# Patient Record
Sex: Male | Born: 2000 | Race: Black or African American | Hispanic: No | Marital: Single | Smoking: Never smoker
Health system: Southern US, Community
[De-identification: ages and names within clinical notes are randomized; demographics above are authoritative.]

## PROBLEM LIST (undated history)

## (undated) DIAGNOSIS — F909 Attention-deficit hyperactivity disorder, unspecified type: Secondary | ICD-10-CM

---
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
## 2021-06-20 ENCOUNTER — Ambulatory Visit: Payer: Self-pay

## 2021-06-22 ENCOUNTER — Other Ambulatory Visit: Payer: Self-pay

## 2021-06-22 ENCOUNTER — Ambulatory Visit
Admission: RE | Admit: 2021-06-22 | Discharge: 2021-06-22 | Disposition: A | Discharge status: Home / Self Care | Payer: Medicaid Other | Source: Ambulatory Visit | Attending: Physician Assistant | Admitting: Physician Assistant

## 2021-06-22 VITALS — BP 101/66 | HR 72 | Temp 97.9°F | Resp 18

## 2021-06-22 DIAGNOSIS — R350 Frequency of micturition: Secondary | ICD-10-CM | POA: Insufficient documentation

## 2021-06-22 DIAGNOSIS — Z113 Encounter for screening for infections with a predominantly sexual mode of transmission: Secondary | ICD-10-CM | POA: Diagnosis not present

## 2021-06-22 LAB — POCT URINALYSIS DIP (MANUAL ENTRY)
Bilirubin, UA: NEGATIVE
Blood, UA: NEGATIVE
Glucose, UA: NEGATIVE mg/dL
Ketones, POC UA: NEGATIVE mg/dL
Leukocytes, UA: NEGATIVE
Nitrite, UA: NEGATIVE
Protein Ur, POC: NEGATIVE mg/dL
Spec Grav, UA: 1.025 (ref 1.010–1.025)
Urobilinogen, UA: 0.2 E.U./dL
pH, UA: 6 (ref 5.0–8.0)

## 2021-06-22 NOTE — ED Provider Notes (Signed)
[facility]    CSN: [id number] Arrival date & time: 06/22/21  1409      History   Chief Complaint Chief Complaint  Patient presents with   Urinary Frequency    HPI Brent Heath is a 21 y.o. male.   Patient here today for evaluation of urinary frequency that started 1-2 weeks ago. HE has not had any dysuria, discharge and hematuria. He does not report any treatment for symptoms.   The history is provided by the patient.  Urinary Frequency Pertinent negatives include no shortness of breath.   Past Medical History:  Diagnosis Date   Attention deficit hyperactivity disorder (ADHD)    Concussion     There are no problems to display for this patient.   History reviewed. No pertinent surgical history.     Home Medications    Prior to Admission medications   Medication Sig Start Date End Date Taking? Authorizing Provider  cetirizine (ZYRTEC) 10 MG tablet Take 1 tablet (10 mg total) by mouth daily for 15 days. 06/29/17 07/14/17  Wieters, Hallie C, PA-C  fluticasone (FLONASE) 50 MCG/ACT nasal spray Place 2 sprays into both nostrils daily for 5 days. 06/29/17 07/04/17  Wieters, Hallie C, PA-C  lisdexamfetamine (VYVANSE) 30 MG capsule Take 30 mg by mouth daily.    [provider]    Family History History reviewed. No pertinent family history.  Social History Social History   Tobacco Use   Smoking status: Never   Smokeless tobacco: Never  Substance Use Topics   Alcohol use: No   Drug use: No     Allergies   Amoxicillin and Penicillins   Review of Systems Review of Systems  Constitutional:  Negative for chills and fever.  Eyes:  Negative for discharge and redness.  Respiratory:  Negative for shortness of breath.   Genitourinary:  Positive for frequency. Negative for dysuria and penile discharge.  Neurological:  Negative for numbness.    Physical Exam Triage Vital Signs ED Triage Vitals  Enc Vitals Group     BP 06/22/21 1420  101/66     Pulse Rate 06/22/21 1420 72     Resp 06/22/21 1420 18     Temp 06/22/21 1420 97.9 F (36.6 C)     Temp Source 06/22/21 1420 Oral     SpO2 06/22/21 1420 99 %     Weight --      Height --      Head Circumference --      Peak Flow --      Pain Score 06/22/21 1423 0     Pain Loc --      Pain Edu? --      Excl. in [place]? --    No data found.  Updated Vital Signs BP 101/66 (BP Location: Left Arm)    Pulse 72    Temp 97.9 F (36.6 C) (Oral)    Resp 18    SpO2 99%     Physical Exam Vitals and nursing note reviewed.  Constitutional:      General: He is not in acute distress.    Appearance: Normal appearance. He is not ill-appearing.  HENT:     Head: Normocephalic and atraumatic.  Eyes:     Conjunctiva/sclera: Conjunctivae normal.  Cardiovascular:     Rate and Rhythm: Normal rate.  Pulmonary:     Effort: Pulmonary effort is normal.  Neurological:     Mental Status: He is alert.  Psychiatric:  Mood and Affect: Mood normal.        Behavior: Behavior normal.        Thought Content: Thought content normal.     UC Treatments / Results  Labs (all labs ordered are listed, but only abnormal results are displayed) Labs Reviewed  URINE CULTURE  POCT URINALYSIS DIP (MANUAL ENTRY)  CYTOLOGY, (ORAL, ANAL, URETHRAL) ANCILLARY ONLY    EKG   Radiology No results found.  Procedures Procedures (including critical care time)  Medications Ordered in UC Medications - No data to display  Initial Impression / Assessment and Plan / UC Course  I have reviewed the triage vital signs and the nursing notes.  Pertinent labs & imaging results that were available during my care of the patient were reviewed by me and considered in my medical decision making (see chart for details).    UA without sign of UTI-- will order culture as well as STD screening. Pt declines HIV, Hepatitis, and RPR.  Recommend follow up with any further concerns.   Final Clinical Impressions(s) /  UC Diagnoses   Final diagnoses:  Screening for STD (sexually transmitted disease)   Discharge Instructions   None    ED Prescriptions   None    PDMP not reviewed this encounter.   Francene Finders, PA-C 06/22/21 1551

## 2021-06-22 NOTE — ED Triage Notes (Signed)
1-2 week h/o urinary frequency and feeling like he can't empty his bladder. Denies penile discharge, dysuria and hematuria. Pt is requesting STD testing.

## 2021-06-23 LAB — CYTOLOGY, (ORAL, ANAL, URETHRAL) ANCILLARY ONLY
Chlamydia: POSITIVE — AB
Comment: NEGATIVE
Comment: NEGATIVE
Comment: NORMAL
Neisseria Gonorrhea: NEGATIVE
Trichomonas: NEGATIVE

## 2021-06-23 LAB — URINE CULTURE: Culture: NO GROWTH

## 2021-06-24 ENCOUNTER — Telehealth (HOSPITAL_COMMUNITY): Payer: Self-pay | Admitting: Emergency Medicine

## 2021-06-24 MED ORDER — DOXYCYCLINE HYCLATE 100 MG PO CAPS: 100.0000 mg | ORAL_CAPSULE | Freq: Two times a day (BID) | ORAL | 0 refills | Status: AC

## 2021-07-02 ENCOUNTER — Telehealth: Payer: Medicaid Other | Admitting: Nurse Practitioner

## 2021-07-02 DIAGNOSIS — A749 Chlamydial infection, unspecified: Secondary | ICD-10-CM | POA: Diagnosis not present

## 2021-07-02 MED ORDER — DOXYCYCLINE HYCLATE 100 MG PO TABS: 100.0000 mg | ORAL_TABLET | Freq: Once | ORAL | 0 refills | Status: AC

## 2021-07-02 NOTE — Progress Notes (Signed)
Virtual Visit Consent   Brent Heath, you are scheduled for a virtual visit with a [facility] provider today.     Just as with appointments in the office, your consent must be obtained to participate.  Your consent will be active for this visit and any virtual visit you may have with one of our providers in the next 365 days.     If you have a MyChart account, a copy of this consent can be sent to you electronically.  All virtual visits are billed to your insurance company just like a traditional visit in the office.    As this is a virtual visit, video technology does not allow for your provider to perform a traditional examination.  This may limit your provider's ability to fully assess your condition.  If your provider identifies any concerns that need to be evaluated in person or the need to arrange testing (such as labs, EKG, etc.), we will make arrangements to do so.     Although advances in technology are sophisticated, we cannot ensure that it will always work on either your end or our end.  If the connection with a video visit is poor, the visit may have to be switched to a telephone visit.  With either a video or telephone visit, we are not always able to ensure that we have a secure connection.     I need to obtain your verbal consent now.   Are you willing to proceed with your visit today?    Brent Heath has provided verbal consent on 07/02/2021 for a virtual visit (video or telephone).   Apolonio Schneiders, FNP   Date: 07/02/2021 2:11 PM   Virtual Visit via Video Note   I, Apolonio Schneiders, connected with  Brent Heath  [name], 05-07-2001) on 07/02/21 at  2:30 PM EST by a video-enabled telemedicine application and verified that I am speaking with the correct person using two identifiers.  Location: Patient: Virtual Visit Location Patient: Home Provider: Virtual Visit Location Provider: Home Office   I discussed the limitations of evaluation and management by  telemedicine and the availability of in person appointments. The patient expressed understanding and agreed to proceed.    History of Present Illness: Brent Heath is a 21 y.o. who identifies as a male who was assigned male at birth, and is being seen today for follow up regarding his recent treatment for Chlamydia. He lost the last pill he was suppose to take tonight in his 7 day regimen. He has taken all of the other pills on time.   Was seen at [facility] for STI testing and symptoms prior   Problems: There are no problems to display for this patient.   Allergies:  Allergies  Allergen Reactions   Amoxicillin Hives   Penicillins Hives   Medications:  Current Outpatient Medications:    cetirizine (ZYRTEC) 10 MG tablet, Take 1 tablet (10 mg total) by mouth daily for 15 days., Disp: 15 tablet, Rfl: 0   fluticasone (FLONASE) 50 MCG/ACT nasal spray, Place 2 sprays into both nostrils daily for 5 days., Disp: 1 g, Rfl: 0   lisdexamfetamine (VYVANSE) 30 MG capsule, Take 30 mg by mouth daily., Disp: , Rfl:   Observations/Objective: Patient is well-developed, well-nourished in no acute distress.  Resting comfortably at home.  Head is normocephalic, atraumatic.  No labored breathing.  Speech is clear and coherent with logical content.  Patient is alert and oriented at baseline.    Assessment and Plan: 1.  Chlamydia Refilled last pill for patient, advised to wait 2 weeks after finishing treatment prior to sexual intercourse  - doxycycline (VIBRA-TABS) 100 MG tablet; Take 1 tablet (100 mg total) by mouth once for 1 dose.  Dispense: 1 tablet; Refill: 0    Take with food  Follow Up Instructions: I discussed the assessment and treatment plan with the patient. The patient was provided an opportunity to ask questions and all were answered. The patient agreed with the plan and demonstrated an understanding of the instructions.  A copy of instructions were sent to the patient via MyChart unless  otherwise noted below.    The patient was advised to call back or seek an in-person evaluation if the symptoms worsen or if the condition fails to improve as anticipated.  Time:  I spent 64minutes with the patient via telehealth technology discussing the above problems/concerns.    Apolonio Schneiders, FNP

## 2021-07-21 ENCOUNTER — Telehealth: Payer: Medicaid Other | Admitting: Physician Assistant

## 2021-07-21 DIAGNOSIS — Z708 Other sex counseling: Secondary | ICD-10-CM | POA: Diagnosis not present

## 2021-07-21 NOTE — Progress Notes (Signed)
Virtual Visit Consent   [name], you are scheduled for a virtual visit with a Brent Heath provider today.     Just as with appointments in the office, your consent must be obtained to participate.  Your consent will be active for this visit and any virtual visit you may have with one of our providers in the next 365 days.     If you have a MyChart account, a copy of this consent can be sent to you electronically.  All virtual visits are billed to your insurance company just like a traditional visit in the office.    As this is a virtual visit, video technology does not allow for your provider to perform a traditional examination.  This may limit your provider's ability to fully assess your condition.  If your provider identifies any concerns that need to be evaluated in person or the need to arrange testing (such as labs, EKG, etc.), we will make arrangements to do so.     Although advances in technology are sophisticated, we cannot ensure that it will always work on either your end or our end.  If the connection with a video visit is poor, the visit may have to be switched to a telephone visit.  With either a video or telephone visit, we are not always able to ensure that we have a secure connection.     I need to obtain your verbal consent now.   Are you willing to proceed with your visit today?    [name] has provided verbal consent on 07/21/2021 for a virtual visit (video or telephone).   [name], PA-C   Date: 07/21/2021 3:42 PM   Virtual Visit via Video Note   I, [name], connected with  Brent Heath  ([id number], 03/28/2001) on 07/21/21 at  3:30 PM EST by a video-enabled telemedicine application and verified that I am speaking with the correct person using two identifiers.  Location: Patient: Virtual Visit Location Patient: Home Provider: Virtual Visit Location Provider: Home Office   I discussed the limitations of evaluation and  management by telemedicine and the availability of in person appointments. The patient expressed understanding and agreed to proceed.    History of Present Illness: Brent Heath is a 21 y.o. who identifies as a male who was assigned male at birth, and is being seen today for STI questions. He was screened at [facility] on 06/22/21. Tested positive for Chlamydia. Started Doxycycline on 06/25/21. Took all doxycycline (had lost last pill, but was seen on 07/02/21 and had refilled for last pill). Completed series on 07/02/21. Has not had any other symptoms, previously had urine frequency. No recurrence of urine frequency. Has abstained from sexual activity since before he was tested. He is curious if he is clear of chlamydia.     Problems:  Patient Active Problem List   Diagnosis Date Noted   ADHD (attention deficit hyperactivity disorder), combined type 10/20/2019    Allergies:  Allergies  Allergen Reactions   Amoxicillin Hives   Penicillins Hives   Medications:  Current Outpatient Medications:    cetirizine (ZYRTEC) 10 MG tablet, Take 1 tablet (10 mg total) by mouth daily for 15 days., Disp: 15 tablet, Rfl: 0   fluticasone (FLONASE) 50 MCG/ACT nasal spray, Place 2 sprays into both nostrils daily for 5 days., Disp: 1 g, Rfl: 0   lisdexamfetamine (VYVANSE) 30 MG capsule, Take 30 mg by mouth daily., Disp: , Rfl:   Observations/Objective: Patient is well-developed,  well-nourished in no acute distress.  Resting comfortably at home.  Head is normocephalic, atraumatic.  No labored breathing.  Speech is clear and coherent with logical content.  Patient is alert and oriented at baseline.    Assessment and Plan: 1. Encounter for sexual health education  - Discussed he completed treatment appropriately and has not had recurrence of symptoms so he should be ok, but I could not say with 100% confidence that he is clear without testing - He is going to seek testing for cure tomorrow with UC or local  health department - Practice safe sex  Follow Up Instructions: I discussed the assessment and treatment plan with the patient. The patient was provided an opportunity to ask questions and all were answered. The patient agreed with the plan and demonstrated an understanding of the instructions.  A copy of instructions were sent to the patient via MyChart unless otherwise noted below.    The patient was advised to call back or seek an in-person evaluation if the symptoms worsen or if the condition fails to improve as anticipated.  Time:  I spent 14 minutes with the patient via telehealth technology discussing the above problems/concerns.    [name], PA-C

## 2021-07-21 NOTE — Patient Instructions (Signed)
Jesper Gullickson, thank you for joining [name], PA-C for today's virtual visit.  While this provider is not your primary care provider (PCP), if your PCP is located in our provider database this encounter information will be shared with them immediately following your visit.  Consent: (Patient) Brent Heath provided verbal consent for this virtual visit at the beginning of the encounter.  Current Medications:  Current Outpatient Medications:    cetirizine (ZYRTEC) 10 MG tablet, Take 1 tablet (10 mg total) by mouth daily for 15 days., Disp: 15 tablet, Rfl: 0   fluticasone (FLONASE) 50 MCG/ACT nasal spray, Place 2 sprays into both nostrils daily for 5 days., Disp: 1 g, Rfl: 0   lisdexamfetamine (VYVANSE) 30 MG capsule, Take 30 mg by mouth daily., Disp: , Rfl:    Medications ordered in this encounter:  No orders of the defined types were placed in this encounter.    *If you need refills on other medications prior to your next appointment, please contact your pharmacy*  Follow-Up: Call back or seek an in-person evaluation if the symptoms worsen or if the condition fails to improve as anticipated.  Other Instructions Safe Sex Practicing safe sex means taking steps before and during sex to reduce your risk of: Getting an STI (sexually transmitted infection). Giving your partner an STI. Unwanted or unplanned pregnancy. How to practice safe sex Ways you can practice safe sex  Limit your sexual partners to only one partner who is having sex with only you. Avoid using alcohol and drugs before having sex. Alcohol and drugs can affect your judgment. Before having sex with a new partner: Talk to your partner about past partners, past STIs, and drug use. Get screened for STIs and discuss the results with your partner. Ask your partner to get screened too. Check your body regularly for sores, blisters, rashes, or unusual discharge. If you notice any of these problems, visit  your health care provider. Avoid sexual contact if you have symptoms of an infection or you are being treated for an STI. While having sex, use a condom. Make sure to: Use a condom every time you have vaginal, oral, or anal sex. Both females and males should wear condoms during oral sex. Keep condoms in place from the beginning to the end of sexual activity. Use a latex condom, if possible. Latex condoms offer the best protection. Use only water-based lubricants with a condom. Using petroleum-based lubricants or oils will weaken the condom and increase the chance that it will break. Ways your health care provider can help you practice safe sex  See your health care provider for regular screenings, exams, and tests for STIs. Talk with your health care provider about what kind of birth control (contraception) is best for you. Get vaccinated against hepatitis B and human papillomavirus (HPV). If you are at risk of being infected with HIV (human immunodeficiency virus), talk with your health care provider about taking a prescription medicine to prevent HIV infection. You are at risk for HIV if you: Are a man who has sex with other men. Are sexually active with more than one partner. Take drugs by injection. Have a sex partner who has HIV. Have unprotected sex. Have sex with someone who has sex with both men and women. Have had an STI. Follow these instructions at home: Take over-the-counter and prescription medicines only as told by your health care provider. Keep all follow-up visits. This is important. Where to find more information Centers for Disease Control  and Prevention: FootballExhibition.com.br Planned Parenthood: www.plannedparenthood.org Office on [employer] Health: http://hoffman.com/ Summary Practicing safe sex means taking steps before and during sex to reduce your risk getting an STI, giving your partner an STI, and having an unwanted or unplanned pregnancy. Before having sex with a new  partner, talk to your partner about past partners, past STIs, and drug use. Use a condom every time you have vaginal, oral, or anal sex. Both females and males should wear condoms during oral sex. Check your body regularly for sores, blisters, rashes, or unusual discharge. If you notice any of these problems, visit your health care provider. See your health care provider for regular screenings, exams, and tests for STIs. This information is not intended to replace advice given to you by your health care provider. Make sure you discuss any questions you have with your health care provider. Document Revised: 11/03/2019 Document Reviewed: 11/03/2019 Elsevier Patient Education  2022 [employer].    If you have been instructed to have an in-person evaluation today at a local Urgent Care facility, please use the link below. It will take you to a list of all of our available [facility], including address, phone number and hours of operation. Please do not delay care.  [facility]  If you or a family member do not have a primary care provider, use the link below to schedule a visit and establish care. When you choose a [facility] primary care physician or advanced practice provider, you gain a long-term partner in health. Find a Primary Care Provider  Learn more about [facility]'s in-office and virtual care options: [facility] - Get Care Now

## 2022-01-23 ENCOUNTER — Ambulatory Visit: Payer: Self-pay

## 2022-05-15 ENCOUNTER — Ambulatory Visit (HOSPITAL_COMMUNITY)
Admission: RE | Admit: 2022-05-15 | Discharge: 2022-05-15 | Disposition: A | Discharge status: Home / Self Care | Payer: Medicaid Other | Source: Ambulatory Visit | Attending: Nurse Practitioner | Admitting: Nurse Practitioner

## 2022-05-15 ENCOUNTER — Encounter (HOSPITAL_COMMUNITY): Payer: Self-pay

## 2022-05-15 VITALS — BP 159/79 | HR 100 | Temp 98.7°F | Resp 18

## 2022-05-15 DIAGNOSIS — Z202 Contact with and (suspected) exposure to infections with a predominantly sexual mode of transmission: Secondary | ICD-10-CM | POA: Diagnosis not present

## 2022-05-15 DIAGNOSIS — Z113 Encounter for screening for infections with a predominantly sexual mode of transmission: Secondary | ICD-10-CM | POA: Diagnosis present

## 2022-05-15 LAB — HIV ANTIBODY (ROUTINE TESTING W REFLEX): HIV Screen 4th Generation wRfx: NONREACTIVE

## 2022-05-15 NOTE — ED Provider Notes (Signed)
[facility]    CSN: [id number] Arrival date & time: 05/15/22  1559      History   Chief Complaint Chief Complaint  Patient presents with   SEXUALLY TRANSMITTED DISEASE    HPI Brent Heath is a 21 y.o. male.   Subjective:  Brent Heath is a 21 y.o. male who presents for STI testing. He denies any symptoms at this time. No burning with urination or penile discharge. He is sexually active with one male partner and he does not use condoms. He denies any known exposure to any STIs.  He has a history of chlamydia in the past.  The following portions of the patient's history were reviewed and updated as appropriate: allergies, current medications, past family history, past medical history, past social history, past surgical history, and problem list.        Past Medical History:  Diagnosis Date   Attention deficit hyperactivity disorder (ADHD)    Concussion     Patient Active Problem List   Diagnosis Date Noted   ADHD (attention deficit hyperactivity disorder), combined type 10/20/2019    History reviewed. No pertinent surgical history.     Home Medications    Prior to Admission medications   Medication Sig Start Date End Date Taking? Authorizing Provider  cetirizine (ZYRTEC) 10 MG tablet Take 1 tablet (10 mg total) by mouth daily for 15 days. 06/29/17 07/14/17  Wieters, Hallie C, PA-C  fluticasone (FLONASE) 50 MCG/ACT nasal spray Place 2 sprays into both nostrils daily for 5 days. 06/29/17 07/04/17  Wieters, Hallie C, PA-C  lisdexamfetamine (VYVANSE) 30 MG capsule Take 30 mg by mouth daily.    [provider]    Family History History reviewed. No pertinent family history.  Social History Social History   Tobacco Use   Smoking status: Never   Smokeless tobacco: Never  Substance Use Topics   Alcohol use: No   Drug use: No     Allergies   Amoxicillin and Penicillins   Review of Systems Review of Systems  Genitourinary:   Negative for dysuria, flank pain, frequency, genital sores, hematuria, penile discharge, penile pain, penile swelling and testicular pain.  Musculoskeletal:  Negative for back pain and myalgias.     Physical Exam Triage Vital Signs ED Triage Vitals  Enc Vitals Group     BP 05/15/22 1632 (!) 159/79     Pulse Rate 05/15/22 1632 100     Resp 05/15/22 1632 18     Temp 05/15/22 1632 98.7 F (37.1 C)     Temp Source 05/15/22 1632 Oral     SpO2 05/15/22 1632 97 %     Weight --      Height --      Head Circumference --      Peak Flow --      Pain Score 05/15/22 1631 0     Pain Loc --      Pain Edu? --      Excl. in [place]? --    No data found.  Updated Vital Signs BP (!) 159/79 (BP Location: Right Arm)   Pulse 100   Temp 98.7 F (37.1 C) (Oral)   Resp 18   SpO2 97%   Visual Acuity Right Eye Distance:   Left Eye Distance:   Bilateral Distance:    Right Eye Near:   Left Eye Near:    Bilateral Near:     Physical Exam Vitals reviewed.  Constitutional:      Appearance:  Normal appearance.  Cardiovascular:     Rate and Rhythm: Normal rate.  Pulmonary:     Effort: Pulmonary effort is normal.  Abdominal:     Palpations: Abdomen is soft.  Genitourinary:    Comments: Deferred. Patient perform self swab for testing. Musculoskeletal:        General: Normal range of motion.     Cervical back: Normal range of motion and neck supple.  Skin:    General: Skin is warm and dry.  Neurological:     General: No focal deficit present.     Mental Status: He is alert and oriented to person, place, and time.      UC Treatments / Results  Labs (all labs ordered are listed, but only abnormal results are displayed) Labs Reviewed  HIV ANTIBODY (ROUTINE TESTING W REFLEX)  RPR  CYTOLOGY, (ORAL, ANAL, URETHRAL) ANCILLARY ONLY    EKG   Radiology No results found.  Procedures Procedures (including critical care time)  Medications Ordered in UC Medications - No data to  display  Initial Impression / Assessment and Plan / UC Course  I have reviewed the triage vital signs and the nursing notes.  Pertinent labs & imaging results that were available during my care of the patient were reviewed by me and considered in my medical decision making (see chart for details).    21 year old sexually active male presenting for STI testing.  He is asymptomatic at this time but denies any known exposures to any STIs.  Testing for chlamydia, gonorrhea, trichomonas, syphilis and HIV pending.  Safe sex practices highly encouraged.  Patient advised to consider PrEP therapy.  Further recommendations based on the results of pending test.  Today's evaluation has revealed no signs of a dangerous process. Discussed diagnosis with patient and/or guardian. Patient and/or guardian aware of their diagnosis, possible red flag symptoms to watch out for and need for close follow up. Patient and/or guardian understands verbal and written discharge instructions. Patient and/or guardian comfortable with plan and disposition.  Patient and/or guardian has a clear mental status at this time, good insight into illness (after discussion and teaching) and has clear judgment to make decisions regarding their care  Documentation was completed with the aid of voice recognition software. Transcription may contain typographical errors. Final Clinical Impressions(s) / UC Diagnoses   Final diagnoses:  Screening examination for STD (sexually transmitted disease)     Discharge Instructions      Testing for gonorrhea, chlamydia, trichomonas, syphilis and HIV is pending. You should not have any sexual activity until you receive the results of the tests. You will only be notified for positive results. You may go online to MyChart and review your results. Practice safe sex practices by wearing a condom every time you have sex. Remember that people who have STIs may not experience any symptoms. However, even  without symptoms, these infections can be spread from person to person and require treatment. STIs can be treated, and many STIs can be cured. However, some STIs cannot be cured and will affect you for the rest of your life. It's important to be checked regularly for STIs. You should also consider taking pre-exposure prophylaxis (PrEP) to prevent HIV infection.    ED Prescriptions   None    PDMP not reviewed this encounter.   Lurline Idol, [place] 05/15/22 1757

## 2022-05-15 NOTE — Discharge Instructions (Signed)
Testing for gonorrhea, chlamydia, trichomonas, syphilis and HIV is pending. You should not have any sexual activity until you receive the results of the tests. You will only be notified for positive results. You may go online to MyChart and review your results. Practice safe sex practices by wearing a condom every time you have sex. Remember that people who have STIs may not experience any symptoms. However, even without symptoms, these infections can be spread from person to person and require treatment. STIs can be treated, and many STIs can be cured. However, some STIs cannot be cured and will affect you for the rest of your life. It's important to be checked regularly for STIs. You should also consider taking pre-exposure prophylaxis (PrEP) to prevent HIV infection.

## 2022-05-15 NOTE — ED Triage Notes (Signed)
Pt presents for STD testing. Denies any current symptoms at this time.

## 2022-05-16 LAB — CYTOLOGY, (ORAL, ANAL, URETHRAL) ANCILLARY ONLY
Chlamydia: NEGATIVE
Comment: NEGATIVE
Comment: NEGATIVE
Comment: NORMAL
Neisseria Gonorrhea: NEGATIVE
Trichomonas: POSITIVE — AB

## 2022-05-16 LAB — RPR: RPR Ser Ql: NONREACTIVE

## 2022-05-19 ENCOUNTER — Telehealth (HOSPITAL_COMMUNITY): Payer: Self-pay | Admitting: Emergency Medicine

## 2022-05-19 MED ORDER — METRONIDAZOLE 500 MG PO TABS: 2000.0000 mg | ORAL_TABLET | Freq: Once | ORAL | 0 refills | Status: AC

## 2022-06-21 ENCOUNTER — Other Ambulatory Visit: Payer: Self-pay

## 2022-06-21 ENCOUNTER — Encounter (HOSPITAL_COMMUNITY): Payer: Self-pay | Admitting: Emergency Medicine

## 2022-06-21 ENCOUNTER — Ambulatory Visit (HOSPITAL_COMMUNITY)
Admission: EM | Admit: 2022-06-21 | Discharge: 2022-06-21 | Disposition: A | Discharge status: Home / Self Care | Payer: Medicaid Other | Attending: Family Medicine | Admitting: Family Medicine

## 2022-06-21 DIAGNOSIS — Z113 Encounter for screening for infections with a predominantly sexual mode of transmission: Secondary | ICD-10-CM

## 2022-06-21 DIAGNOSIS — J111 Influenza due to unidentified influenza virus with other respiratory manifestations: Secondary | ICD-10-CM | POA: Diagnosis not present

## 2022-06-21 MED ORDER — ONDANSETRON 4 MG PO TBDP: 4.0000 mg | ORAL_TABLET | Freq: Three times a day (TID) | ORAL | 0 refills | Status: AC | PRN

## 2022-06-21 MED ORDER — OSELTAMIVIR PHOSPHATE 75 MG PO CAPS: 75.0000 mg | ORAL_CAPSULE | Freq: Two times a day (BID) | ORAL | 0 refills | Status: AC

## 2022-06-21 NOTE — ED Triage Notes (Signed)
Complains of cough, congestion, runny nose, body aches and chills.  Symptoms started 2-3 days ago.    Patient was seen 05/15/2022 for std screening.  Did require treatment and has finished treatment.  Asking to be retested

## 2022-06-21 NOTE — Discharge Instructions (Signed)
Take oseltamivir 75 mg--1 capsule 2 times daily for 5 days; I am prescribing this since you are exposed to your sister who tested positive for the flu.  Ondansetron dissolved in the mouth every 8 hours as needed for nausea or vomiting. Clear liquids and bland things to eat.

## 2022-06-21 NOTE — ED Provider Notes (Signed)
[facility]    CSN: [id number] Arrival date & time: 06/21/22  1654      History   Chief Complaint No chief complaint on file.   HPI Brent Heath is a 22 y.o. male.   HPI Here for cough and congestion, subjective fever, and chills.  He has also had myalgia and malaise.  Symptoms began the evening of January 7.  He has thrown up once and has had some nausea.  He has been exposed to his sister who tested positive for the flu  On December 4 he was screening for TEDs, and he was found to be positive for trichomoniasis.  He took the medication shortly thereafter, and wishes to be retested.  HIV and RPR were negative in December.  Past Medical History:  Diagnosis Date   Attention deficit hyperactivity disorder (ADHD)    Concussion     Patient Active Problem List   Diagnosis Date Noted   ADHD (attention deficit hyperactivity disorder), combined type 10/20/2019    History reviewed. No pertinent surgical history.     Home Medications    Prior to Admission medications   Medication Sig Start Date End Date Taking? Authorizing Provider  ondansetron (ZOFRAN-ODT) 4 MG disintegrating tablet Take 1 tablet (4 mg total) by mouth every 8 (eight) hours as needed for nausea or vomiting. 06/21/22  Yes Barrett Henle, MD  oseltamivir (TAMIFLU) 75 MG capsule Take 1 capsule (75 mg total) by mouth every 12 (twelve) hours. 06/21/22  Yes Barrett Henle, MD    Family History History reviewed. No pertinent family history.  Social History Social History   Tobacco Use   Smoking status: Never   Smokeless tobacco: Never  Vaping Use   Vaping Use: Never used  Substance Use Topics   Alcohol use: No   Drug use: No     Allergies   Amoxicillin and Penicillins   Review of Systems Review of Systems   Physical Exam Triage Vital Signs ED Triage Vitals  Enc Vitals Group     BP 06/21/22 1720 (!) 141/50     Pulse Rate 06/21/22 1720 89     Resp 06/21/22 1720 18      Temp 06/21/22 1720 98.8 F (37.1 C)     Temp Source 06/21/22 1720 Oral     SpO2 06/21/22 1720 98 %     Weight --      Height --      Head Circumference --      Peak Flow --      Pain Score 06/21/22 1718 6     Pain Loc --      Pain Edu? --      Excl. in [place]? --    No data found.  Updated Vital Signs BP (!) 141/50 (BP Location: Left Arm)   Pulse 89   Temp 98.8 F (37.1 C) (Oral)   Resp 18   SpO2 98%   Visual Acuity Right Eye Distance:   Left Eye Distance:   Bilateral Distance:    Right Eye Near:   Left Eye Near:    Bilateral Near:     Physical Exam   UC Treatments / Results  Labs (all labs ordered are listed, but only abnormal results are displayed) Labs Reviewed  CYTOLOGY, (ORAL, ANAL, URETHRAL) ANCILLARY ONLY    EKG   Radiology No results found.  Procedures Procedures (including critical care time)  Medications Ordered in UC Medications - No data to display  Initial Impression /  Assessment and Plan / UC Course  I have reviewed the triage vital signs and the nursing notes.  Pertinent labs & imaging results that were available during my care of the patient were reviewed by me and considered in my medical decision making (see chart for details).     I am going to treat empirically for the flu since he has an exposure to someone who tested positive for the flu.  Medication is sent in for the nausea also  Screening for STDs is done today and he will be treated per protocol if positive Final Clinical Impressions(s) / UC Diagnoses   Final diagnoses:  Influenza  Screening for STDs (sexually transmitted diseases)     Discharge Instructions      Take oseltamivir 75 mg--1 capsule 2 times daily for 5 days; I am prescribing this since you are exposed to your sister who tested positive for the flu.  Ondansetron dissolved in the mouth every 8 hours as needed for nausea or vomiting. Clear liquids and bland things to eat.       ED Prescriptions      Medication Sig Dispense Auth. Provider   oseltamivir (TAMIFLU) 75 MG capsule Take 1 capsule (75 mg total) by mouth every 12 (twelve) hours. 10 capsule Barrett Henle, MD   ondansetron (ZOFRAN-ODT) 4 MG disintegrating tablet Take 1 tablet (4 mg total) by mouth every 8 (eight) hours as needed for nausea or vomiting. 10 tablet Windy Carina Gwenlyn Perking, MD      PDMP not reviewed this encounter.   Barrett Henle, MD 06/21/22 [phone]

## 2022-06-23 LAB — CYTOLOGY, (ORAL, ANAL, URETHRAL) ANCILLARY ONLY
Chlamydia: NEGATIVE
Comment: NEGATIVE
Comment: NORMAL
Neisseria Gonorrhea: NEGATIVE

## 2022-08-21 ENCOUNTER — Ambulatory Visit (HOSPITAL_COMMUNITY): Payer: Self-pay
